# Patient Record
Sex: Male | Born: 1945 | Race: Black or African American | Hispanic: No | Marital: Single
Health system: Southern US, Community
[De-identification: ages and names within clinical notes are randomized; demographics above are authoritative.]

---

## 2007-05-01 ENCOUNTER — Emergency Department: Payer: Self-pay

## 2008-10-16 ENCOUNTER — Emergency Department: Payer: Self-pay | Admitting: Emergency Medicine

## 2009-01-11 ENCOUNTER — Emergency Department: Payer: Self-pay | Admitting: Emergency Medicine

## 2009-04-18 ENCOUNTER — Emergency Department: Payer: Self-pay

## 2009-04-22 ENCOUNTER — Emergency Department: Payer: Self-pay | Admitting: Emergency Medicine

## 2009-05-21 ENCOUNTER — Encounter: Payer: Self-pay | Admitting: Geriatric Medicine

## 2009-05-29 ENCOUNTER — Encounter: Payer: Self-pay | Admitting: Geriatric Medicine

## 2009-09-28 ENCOUNTER — Emergency Department: Payer: Self-pay | Admitting: Internal Medicine

## 2009-10-28 ENCOUNTER — Emergency Department: Payer: Self-pay | Admitting: Emergency Medicine

## 2010-04-24 ENCOUNTER — Emergency Department: Payer: Self-pay | Admitting: Unknown Physician Specialty

## 2011-01-02 ENCOUNTER — Ambulatory Visit: Payer: Self-pay | Admitting: Internal Medicine

## 2011-02-25 ENCOUNTER — Emergency Department: Payer: Self-pay | Admitting: Unknown Physician Specialty

## 2011-03-26 ENCOUNTER — Emergency Department: Payer: Self-pay | Admitting: Internal Medicine

## 2011-04-23 ENCOUNTER — Emergency Department: Payer: Self-pay | Admitting: Emergency Medicine

## 2011-08-06 ENCOUNTER — Emergency Department: Payer: Self-pay | Admitting: *Deleted

## 2011-09-05 ENCOUNTER — Emergency Department: Payer: Self-pay | Admitting: Emergency Medicine

## 2011-10-02 ENCOUNTER — Emergency Department: Payer: Self-pay | Admitting: *Deleted

## 2011-10-07 ENCOUNTER — Emergency Department: Payer: Self-pay

## 2012-02-18 ENCOUNTER — Emergency Department: Payer: Self-pay | Admitting: Emergency Medicine

## 2012-02-18 LAB — COMPREHENSIVE METABOLIC PANEL
Alkaline Phosphatase: 95 U/L (ref 50–136)
Anion Gap: 8 (ref 7–16)
BUN: 10 mg/dL (ref 7–18)
Calcium, Total: 8.8 mg/dL (ref 8.5–10.1)
Co2: 27 mmol/L (ref 21–32)
Creatinine: 0.99 mg/dL (ref 0.60–1.30)
EGFR (Non-African Amer.): >60
Osmolality: 274 (ref 275–301)
Potassium: 3.9 mmol/L (ref 3.5–5.1)
SGOT(AST): 41 U/L — ABNORMAL HIGH (ref 15–37)
Sodium: 138 mmol/L (ref 136–145)
Total Protein: 7.4 g/dL (ref 6.4–8.2)

## 2012-02-18 LAB — URINALYSIS, COMPLETE
Blood: NEGATIVE
Protein: NEGATIVE
Specific Gravity: 1.004 (ref 1.003–1.030)
Squamous Epithelial: NONE SEEN
WBC UR: NONE SEEN /HPF (ref 0–5)

## 2012-02-18 LAB — CBC
MCHC: 32.2 g/dL (ref 32.0–36.0)
Platelet: 221 10*3/uL (ref 150–440)
RBC: 4.44 10*6/uL (ref 4.40–5.90)
RDW: 14.8 % — ABNORMAL HIGH (ref 11.5–14.5)

## 2012-06-28 ENCOUNTER — Emergency Department: Payer: Self-pay | Admitting: Emergency Medicine

## 2012-09-13 ENCOUNTER — Emergency Department: Payer: Self-pay | Admitting: Emergency Medicine

## 2012-09-29 ENCOUNTER — Emergency Department: Payer: Self-pay | Admitting: Emergency Medicine

## 2012-10-18 ENCOUNTER — Emergency Department: Payer: Self-pay | Admitting: Emergency Medicine

## 2012-10-18 LAB — CBC
HCT: 45 % (ref 40.0–52.0)
HGB: 14.7 g/dL (ref 13.0–18.0)
MCH: 30 pg (ref 26.0–34.0)
Platelet: 223 10*3/uL (ref 150–440)
RDW: 14.5 % (ref 11.5–14.5)

## 2012-10-18 LAB — COMPREHENSIVE METABOLIC PANEL
Albumin: 3.4 g/dL (ref 3.4–5.0)
Anion Gap: 4 — ABNORMAL LOW (ref 7–16)
BUN: 13 mg/dL (ref 7–18)
Co2: 30 mmol/L (ref 21–32)
Creatinine: 1.14 mg/dL (ref 0.60–1.30)
EGFR (African American): >60
Glucose: 84 mg/dL (ref 65–99)
SGOT(AST): 33 U/L (ref 15–37)
SGPT (ALT): 35 U/L (ref 12–78)
Sodium: 138 mmol/L (ref 136–145)
Total Protein: 7.5 g/dL (ref 6.4–8.2)

## 2012-10-18 LAB — CK TOTAL AND CKMB (NOT AT ARMC)
CK, Total: 363 U/L — ABNORMAL HIGH (ref 35–232)
CK-MB: 3.2 ng/mL (ref 0.5–3.6)

## 2012-10-18 LAB — PRO B NATRIURETIC PEPTIDE: B-Type Natriuretic Peptide: 9 pg/mL (ref 0–125)

## 2012-12-14 ENCOUNTER — Emergency Department: Payer: Self-pay | Admitting: Emergency Medicine

## 2013-01-16 ENCOUNTER — Emergency Department: Payer: Self-pay | Admitting: Emergency Medicine

## 2013-02-13 ENCOUNTER — Emergency Department: Payer: Self-pay | Admitting: Unknown Physician Specialty

## 2013-03-28 ENCOUNTER — Emergency Department: Payer: Self-pay | Admitting: Emergency Medicine

## 2013-07-11 ENCOUNTER — Emergency Department: Payer: Self-pay | Admitting: Emergency Medicine

## 2014-06-18 ENCOUNTER — Emergency Department: Payer: Self-pay | Admitting: Emergency Medicine

## 2014-07-05 ENCOUNTER — Emergency Department: Payer: Self-pay | Admitting: Emergency Medicine

## 2014-07-05 LAB — URINALYSIS, COMPLETE
BILIRUBIN, UR: NEGATIVE
Bacteria: NONE SEEN
Blood: NEGATIVE
GLUCOSE, UR: NEGATIVE mg/dL (ref 0–75)
Hyaline Cast: 3
Ketone: NEGATIVE
LEUKOCYTE ESTERASE: NEGATIVE
Nitrite: NEGATIVE
PROTEIN: NEGATIVE
Ph: 6 (ref 4.5–8.0)
RBC,UR: <1 /HPF (ref 0–5)
SQUAMOUS EPITHELIAL: NONE SEEN
Specific Gravity: 1.008 (ref 1.003–1.030)
WBC UR: NONE SEEN /HPF (ref 0–5)

## 2014-07-05 LAB — CBC WITH DIFFERENTIAL/PLATELET
Basophil #: 0.1 10*3/uL (ref 0.0–0.1)
Basophil %: 1.3 %
Eosinophil #: 0.2 10*3/uL (ref 0.0–0.7)
Eosinophil %: 3.8 %
HCT: 46.5 % (ref 40.0–52.0)
HGB: 14.9 g/dL (ref 13.0–18.0)
LYMPHS ABS: 1.9 10*3/uL (ref 1.0–3.6)
Lymphocyte %: 35.5 %
MCH: 30.4 pg (ref 26.0–34.0)
MCHC: 32.1 g/dL (ref 32.0–36.0)
MCV: 95 fL (ref 80–100)
MONOS PCT: 13.9 %
Monocyte #: 0.7 x10 3/mm (ref 0.2–1.0)
NEUTROS PCT: 45.5 %
Neutrophil #: 2.5 10*3/uL (ref 1.4–6.5)
PLATELETS: 175 10*3/uL (ref 150–440)
RBC: 4.9 10*6/uL (ref 4.40–5.90)
RDW: 13.3 % (ref 11.5–14.5)
WBC: 5.4 10*3/uL (ref 3.8–10.6)

## 2014-07-05 LAB — COMPREHENSIVE METABOLIC PANEL
ALT: 21 U/L
ANION GAP: 8 (ref 7–16)
Albumin: 3.5 g/dL (ref 3.4–5.0)
Alkaline Phosphatase: 68 U/L
BUN: 11 mg/dL (ref 7–18)
Bilirubin,Total: 0.8 mg/dL (ref 0.2–1.0)
CALCIUM: 8.6 mg/dL (ref 8.5–10.1)
Chloride: 109 mmol/L — ABNORMAL HIGH (ref 98–107)
Co2: 27 mmol/L (ref 21–32)
Creatinine: 1.15 mg/dL (ref 0.60–1.30)
EGFR (African American): >60
EGFR (Non-African Amer.): >60
Glucose: 113 mg/dL — ABNORMAL HIGH (ref 65–99)
Osmolality: 287 (ref 275–301)
POTASSIUM: 3.4 mmol/L — AB (ref 3.5–5.1)
SGOT(AST): 22 U/L (ref 15–37)
Sodium: 144 mmol/L (ref 136–145)
Total Protein: 7.6 g/dL (ref 6.4–8.2)

## 2014-07-05 LAB — TROPONIN I

## 2014-10-29 ENCOUNTER — Ambulatory Visit: Payer: Self-pay | Admitting: Internal Medicine

## 2014-10-31 ENCOUNTER — Emergency Department: Payer: Self-pay | Admitting: Student

## 2014-10-31 LAB — CBC
HCT: 38.2 % — ABNORMAL LOW (ref 40.0–52.0)
HGB: 12.1 g/dL — AB (ref 13.0–18.0)
MCH: 27 pg (ref 26.0–34.0)
MCHC: 31.6 g/dL — ABNORMAL LOW (ref 32.0–36.0)
MCV: 86 fL (ref 80–100)
Platelet: 207 10*3/uL (ref 150–440)
RBC: 4.47 10*6/uL (ref 4.40–5.90)
RDW: 14.7 % — ABNORMAL HIGH (ref 11.5–14.5)
WBC: 14 10*3/uL — ABNORMAL HIGH (ref 3.8–10.6)

## 2014-10-31 LAB — COMPREHENSIVE METABOLIC PANEL
ALBUMIN: 1.7 g/dL — AB (ref 3.4–5.0)
ALT: 22 U/L (ref 14–63)
Alkaline Phosphatase: 110 U/L (ref 46–116)
Anion Gap: 5 — ABNORMAL LOW (ref 7–16)
BUN: 9 mg/dL (ref 7–18)
Bilirubin,Total: 0.4 mg/dL (ref 0.2–1.0)
CHLORIDE: 99 mmol/L (ref 98–107)
Calcium, Total: 8.5 mg/dL (ref 8.5–10.1)
Co2: 30 mmol/L (ref 21–32)
Creatinine: 0.8 mg/dL (ref 0.60–1.30)
EGFR (Non-African Amer.): >60
Glucose: 103 mg/dL — ABNORMAL HIGH (ref 65–99)
Osmolality: 267 (ref 275–301)
Potassium: 4.4 mmol/L (ref 3.5–5.1)
SGOT(AST): 28 U/L (ref 15–37)
Sodium: 134 mmol/L — ABNORMAL LOW (ref 136–145)
TOTAL PROTEIN: 7.2 g/dL (ref 6.4–8.2)

## 2014-10-31 LAB — PROTIME-INR
INR: 1.1
Prothrombin Time: 14.6 secs

## 2014-10-31 LAB — TROPONIN I: Troponin-I: <0.02 ng/mL

## 2014-10-31 LAB — APTT: Activated PTT: 34.2 secs (ref 23.6–35.9)

## 2014-10-31 LAB — CK TOTAL AND CKMB (NOT AT ARMC)
CK, Total: 117 U/L (ref 39–308)
CK-MB: 1.8 ng/mL (ref 0.5–3.6)

## 2014-11-01 LAB — URINALYSIS, COMPLETE
BLOOD: NEGATIVE
Bilirubin,UR: NEGATIVE
GLUCOSE, UR: NEGATIVE mg/dL (ref 0–75)
Ketone: NEGATIVE
Leukocyte Esterase: NEGATIVE
Nitrite: NEGATIVE
Ph: 7 (ref 4.5–8.0)
Protein: 30
RBC,UR: 2 /HPF (ref 0–5)
SPECIFIC GRAVITY: 1.019 (ref 1.003–1.030)
Squamous Epithelial: NONE SEEN
WBC UR: 2 /HPF (ref 0–5)

## 2014-11-02 LAB — CULTURE, BLOOD (SINGLE)

## 2014-11-12 ENCOUNTER — Observation Stay: Payer: Self-pay | Admitting: Internal Medicine

## 2014-11-27 ENCOUNTER — Ambulatory Visit
Admit: 2014-11-27 | Disposition: A | Discharge status: Home / Self Care | Payer: Self-pay | Attending: Internal Medicine | Admitting: Internal Medicine

## 2014-11-27 DEATH — deceased

## 2015-01-27 NOTE — Discharge Summary (Signed)
PATIENT NAME:  Chase Chase [name], Chase Chase [name] MR#:  [id number] DATE OF BIRTH:  09/04/1946  DATE OF ADMISSION:  11/12/2014 DATE OF DISCHARGE:  11/13/2014  ADMITTING DIAGNOSES: 1.  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  2.  Hepatitis C.  3.  Gastroesophageal reflux disease. 4.  History of prostate cancer.  DIAGNOSES AT THE TIME OF DISCHARGE: 1.  History of brain cancer with vasogenic edema causing intractable seizures.  2.  Hepatitis C.  3.  Gastroesophageal reflux disease.  4.  History of prostate cancer.   PROCEDURES: None.   CONSULTATIONS: Palliative care and oncology. Eventually, oncology consult was canceled.   BRIEF HISTORY AND PHYSICAL AND HOSPITAL COURSE: The patient is Chase [name] 69 year old African American male brought into the ED via EMS after having 3 episodes of seizures at home. Each episode lasted for approximately 5 minutes. Please see the history and physical by done by Dr. Sheryle Hailiamond. The patient had received loading dose of Keppra and the patient was admitted to the hospital. The patient was continued on Keppra and eventually palliative care and  oncology consult was placed. While awaiting for medical [facility], the patient was started on Decadron for vasogenic edema from the brain tumor. Eventually, palliative care was consulted and the patient's clinical situation was discussed with the patient's family members. As the patient's clinical situation is rapidly declining over the past few months with multiple hospitalizations at the [facility] [place] and [facility], the family asked about hospice home. The patient's code status is DO NOT RESUSCITATE. Oncology consult was canceled as per family request and the plan is to transfer him to [facility] to Dr. Harriett SineNancy Phifer's service as soon as Chase [name] bed is available.     PHYSICAL EXAMINATION: VITAL SIGNS: Temperature 98.4, pulse 86, respirations 20, blood pressure 117/79, pulse oximetry 94% at rest on room air.  GENERAL APPEARANCE: Frail appearing.    HEENT: Oropharynx clear. Hearing intact to voice.  NECK: Supple. Trachea is midline. RESPIRATORY:  Lungs are clear to auscultation. No accessory muscle use. No rales, rhonchi.  CARDIOVASCULAR: S1, S2 normal. Regular rate and rhythm. No murmur.  GASTROINTESTINAL: Soft. Bowel sounds are positive in all 4 quadrants. Nontender, nondistended.  GENITOURINARY: No suprapubic tenderness.  EXTREMITIES: No edema. No cyanosis.  SKIN: No rashes. No ulcers. NEUROLOGIC:  Right eye droop. Follows simple commands. Speech is clear.  PSYCHIATRIC: Alert, awake, and oriented to time, place, and person.  LABORATORY AND IMAGING STUDIES: On February 15 the BMP is normal. Troponin less than 0.02. WBC 19.2, hemoglobin 12.4, hematocrit 39.2, platelets of 276,000. Blood cultures, no growth x 2 since admission. CT of the head without contrast, little interval change in appearance of the high right frontal lobe mass with significant surrounding vasogenic edema compatible with tumor.  No new intracranial abnormalities.  MEDICATIONS AT THE TIME OF DISCHARGE: Furosemide 20 mg 1 tablet p.o. once daily, Tylenol 325 mg 2 tablets every 6 hours as needed for mild pain or temperature greater than 100.4, morphine 30 mg for 12 hours 1 tablet p.o. every 12 hours, oxycodone 5 mg in 5 mL oral solution, 5 mL every 4 hours as needed for moderate pain, Keppra 500 mg p.o. q. 12 hours, pantoprazole 40 mg once daily, lorazepam 0.5 mg 1-2 tablets orally or sublingually every 2-4 hours as needed for agitation and anxiety.   ACTIVITY: As tolerated.   DIET: As tolerated.   CODE STATUS:  DO NOT RESUSCITATE.  DISCHARGE INSTRUCTIONS:  Oxygen as needed. Leave in Foley catheter for  urinary incontinence to prevent skin breakdown. The patient will be transferred to [facility]ospice [facility] to Dr. Ernestene Kiel service as soon as Chase [name] bed is available.  TOTAL TIME SPENT ON THE DISCHARGE: 45 minutes.   ____________________________ Ramonita Lab, MD ag:LT D: 11/13/2014  14:09:00 ET T: 11/13/2014 18:46:36 ET JOB#: [id number]  cc: Ned Grace, MD Ramonita Lab, MD, <Dictator> Primary Care Physician   Ramonita Lab MD ELECTRONICALLY SIGNED 11/23/2014 14:53

## 2015-01-27 NOTE — H&P (Signed)
PATIENT NAME:  Chase [name], Chase [name] MR#:  [id number] DATE OF BIRTH:  03/06/1946  DATE OF ADMISSION:  11/12/2014  REFERRING PHYSICIAN:  Loraine LericheMark R. Fanny BienQuale, MD  PRIMARY CARE PHYSICIAN:  With the [employer].   ADMISSION DIAGNOSIS:  Intractable seizures.   HISTORY OF PRESENT ILLNESS:  This is [name] 69 year old African-American male who presents to the Emergency Department via EMS after suffering 3 seizures at home. The longest of the seizures lasted at least 5 minutes. His wife, who is the primary caretaker, called the home health nurse, who recommended transport and evaluation in the Emergency Department. In the Emergency Department, the patient was loaded with Keppra. Otherwise, his vital signs were good, although his son reports that at home he experienced some respiratory distress. In the Emergency Department, the patient was postictal and could not contribute to his own history. At baseline, he is alert and verbal. Past medical history is significant for brain cancer. Due to his recurrent seizures, the Emergency Department called for admission.   REVIEW OF SYSTEMS:  The patient has recently been discharged [facility] [place]. I do not have records at this hospital administration, but since being home, he has been well. The patient is currently very somnolent and confused and cannot contribute to his own history.   PAST MEDICAL HISTORY:  Brain tumor, hepatitis C, seizures, and history of prostate cancer.   PAST SURGICAL HISTORY:  Cervical spine fusion and hernia repair, as well as pericardial window placement.   SOCIAL HISTORY:  The patient is [name] former smoker. He also is [name] former drinker. His son denies any knowledge of drug use.   FAMILY HISTORY:  The patient's mother is deceased of pancreatic cancer, and his father is deceased of colon cancer.   MEDICATIONS: 1.  Furosemide 20 mg 1 tab p.o. daily.  2.  Gabapentin 300 mg 1 capsule p.o. at bedtime.  3.  Morphine 30 mg  1 tab p.o. b.i.d.  4.  Omeprazole 20 mg 1 tablet p.o. daily.  5.  Oxycodone 10 mg 1 tab p.o. every 6 hours.   ALLERGIES:  PENICILLIN.   PERTINENT LABORATORY RESULTS AND RADIOGRAPHIC FINDINGS:  Serum glucose is 92, BUN 17, creatinine 1.03, sodium 136, potassium 3.9, chloride 98, bicarbonate 30, and calcium is 8.9. Troponin is negative. White blood cell count is 19.2, hemoglobin 12.4, hematocrit 39.2, platelet count 276,000, and MCV is 83. Urinalysis is negative for infection. CT of the head without contrast shows little interval change in the appearance of [name] high right frontal lobe mass with significant surrounding vasogenic edema compatible with tumor. Chest x-ray shows mild unchanged linear left basal opacities, and my read is that this appears to be atelectasis.   PHYSICAL EXAMINATION: VITAL SIGNS:  Temperature is 98.3, pulse 99, respirations 20, blood pressure 110/74, and pulse oximetry is 93% on room air but frequently improves to close to 100%.  GENERAL:  The patient is somnolent but in no apparent distress.  HEENT:  Normocephalic, atraumatic. Pupils are equal, round, and reactive to light and accommodation. Extraocular movements are intact. Mucous membranes are moist.  NECK:  Trachea is midline. No adenopathy. Thyroid is nonpalpable and nontender.  CHEST:  Symmetric and atraumatic.  CARDIOVASCULAR:  Regular rate and rhythm. Normal S1 and S2. No rubs, clicks, or murmurs appreciated.  LUNGS:  Clear to auscultation bilaterally. Normal effort and excursion.  ABDOMEN:  Positive bowel sounds. Soft, nontender, nondistended. No hepatosplenomegaly.  GENITOURINARY:  Deferred.  MUSCULOSKELETAL:  The patient  does not follow commands at this time, but I have observed him move both of the upper extremities, although I cannot verify full range of motion. The patient is too weak to walk at home and thus was not challenged for strength testing in the hospital.  SKIN:  Warm and dry. There are no rashes or  lesions.  EXTREMITIES:  No clubbing, cyanosis, or edema.  NEUROLOGIC:  Cranial nerves II through XII appear to be grossly intact, although I cannot get [name] thorough neurologic exam on him, as he is somnolent and somewhat uncooperative.  PSYCHIATRIC:  Difficult to assess due to the patient's postictal state.   ASSESSMENT AND PLAN:  This is [name] 69 year old male admitted for intractable seizures secondary to [name] brain tumor.   1.  Seizures. No seizures witnessed in the Emergency Department. Of course, this abnormal brain activity is likely secondary to his brain tumor. He was loaded with Keppra in the Emergency Department. We will likely continue Keppra following hospital discharge. Neurologic consult at the discretion of the primary team in addition if this is felt necessary after his palliative care consult.  2.  Hepatitis C. The patient is not currently undergoing treatment.  3.  Gastroesophageal reflux disease. We will continue the patient's pantoprazole.  4.  Deep vein thrombosis prophylaxis with heparin.  5.  Gastrointestinal prophylaxis, as above.  6.  Brain cancer. The patient is already a DO NOT RESUSCITATE. He needs [name] palliative care consult to arrange for hospice services at home.   CODE STATUS:  The patient is a DO NOT RESUSCITATE. Again, he does not want resuscitative measures in the event of cardiopulmonary collapse.   TIME SPENT ON ADMISSION ORDERS AND PATIENT CARE:  Approximately 35 minutes.    ____________________________ Kelton Pillar. Sheryle Hail, MD msd:nb D: 11/13/2014 05:31:59 ET T: 11/13/2014 05:44:22 ET JOB#: [id number]  cc: Kelton Pillar. Sheryle Hail, MD, <Dictator> Kelton Pillar Delon Revelo MD ELECTRONICALLY SIGNED 11/20/2014 12:07

## 2015-01-27 NOTE — Consult Note (Signed)
   Comments   I met with pt's wife and son. Both describe a rapid decline over the past few months associated with multiple hospitalizations at the [facility] and [facility]. He was receiving oncology care and was last discharged from the [facility] 8 days ago with a referral to [facility]. However, family have been displeased with the care he has received and no longer feel he can return home. They ask about the Hospice Home. Will ask that the hospice liaison meet with them to discuss transfer.  family request, will cancel oncology consult. Family is focused on comfort/hospice care and were told that patient likely only had two months to live. oncology consult0.76m IV prnHome  Electronic Signatures: Borders, JKirt Boys(NP)  (Signed 16-Feb-16 11:23)  Authored: Palliative Care Phifer, NIzora Gala(MD)  (Signed 16-Feb-16 15:54)  Authored: Palliative Care   Last Updated: 16-Feb-16 15:54 by Phifer, NIzora Gala(MD)

## 2015-01-27 NOTE — Discharge Summary (Signed)
PATIENT NAME:  Chase [name], Chase [name] MR#:  [id number] DATE OF BIRTH:  1945-10-16  DATE OF ADMISSION:  11/12/2014 DATE OF DISCHARGE:  11/14/2014  ADDENDUM    Discharge summary was dictated on 11/13/2014 this is an addendum to the previous discharge summary. Please review discharge summary dictated 11/13/2014 for details.    The patient was not discharged on 11/13/2014 as we planned before, as the patient changed [facility].   The patient was evaluated by physical therapy, as the patient and his family members were pursuing skilled nursing facility. Eventually, after having several discussions the patient's family members, as the patient was feeling weak and tired that day and being lethargic, after talking to palliative care decision was made to sent him to hospice home. The patient was transferred to hospice home to Dr. Harriett SineNancy Phifer's service.   CODE STATUS: His code status was DO NOT RESUSCITATE at that time.    ____________________________ Chase LabAruna Brookelyn Gaynor, MD ag:bm D: 11/20/2014 20:45:15 ET T: 11/21/2014 04:54:0906:29:22 ET JOB#: [id number]  cc: Chase LabAruna Jguadalupe Opiela, MD, <Dictator> Chase LabARUNA Demaree Liberto MD ELECTRONICALLY SIGNED 11/23/2014 15:05

## 2015-01-27 NOTE — Consult Note (Signed)
   Comments   Pt stated that he did not want to go to the Hospice Home. Discussed with wife and sons. They would accept [facility] where pt has been before and which is close to family.   Electronic Signatures: Drisana Schweickert, Harriett SineNancy (MD)  (Signed 16-Feb-16 15:55)  Authored: Palliative Care   Last Updated: 16-Feb-16 15:55 by Trayvon Trumbull, Harriett SineNancy (MD)

## 2016-02-03 IMAGING — CR DG CHEST 2V
1 series · 3 of 3 positions shown · non-contrast
Comparison: Chest radiograph performed 02/18/2012

CLINICAL DATA: Acute onset of generalized weakness. Slipping out of
bed. Initial encounter.

EXAM:
CHEST  2 VIEW

[Series 2: w chest lat · 0.14mm/px · 3 of 3 slices shown]
[im 1/3]
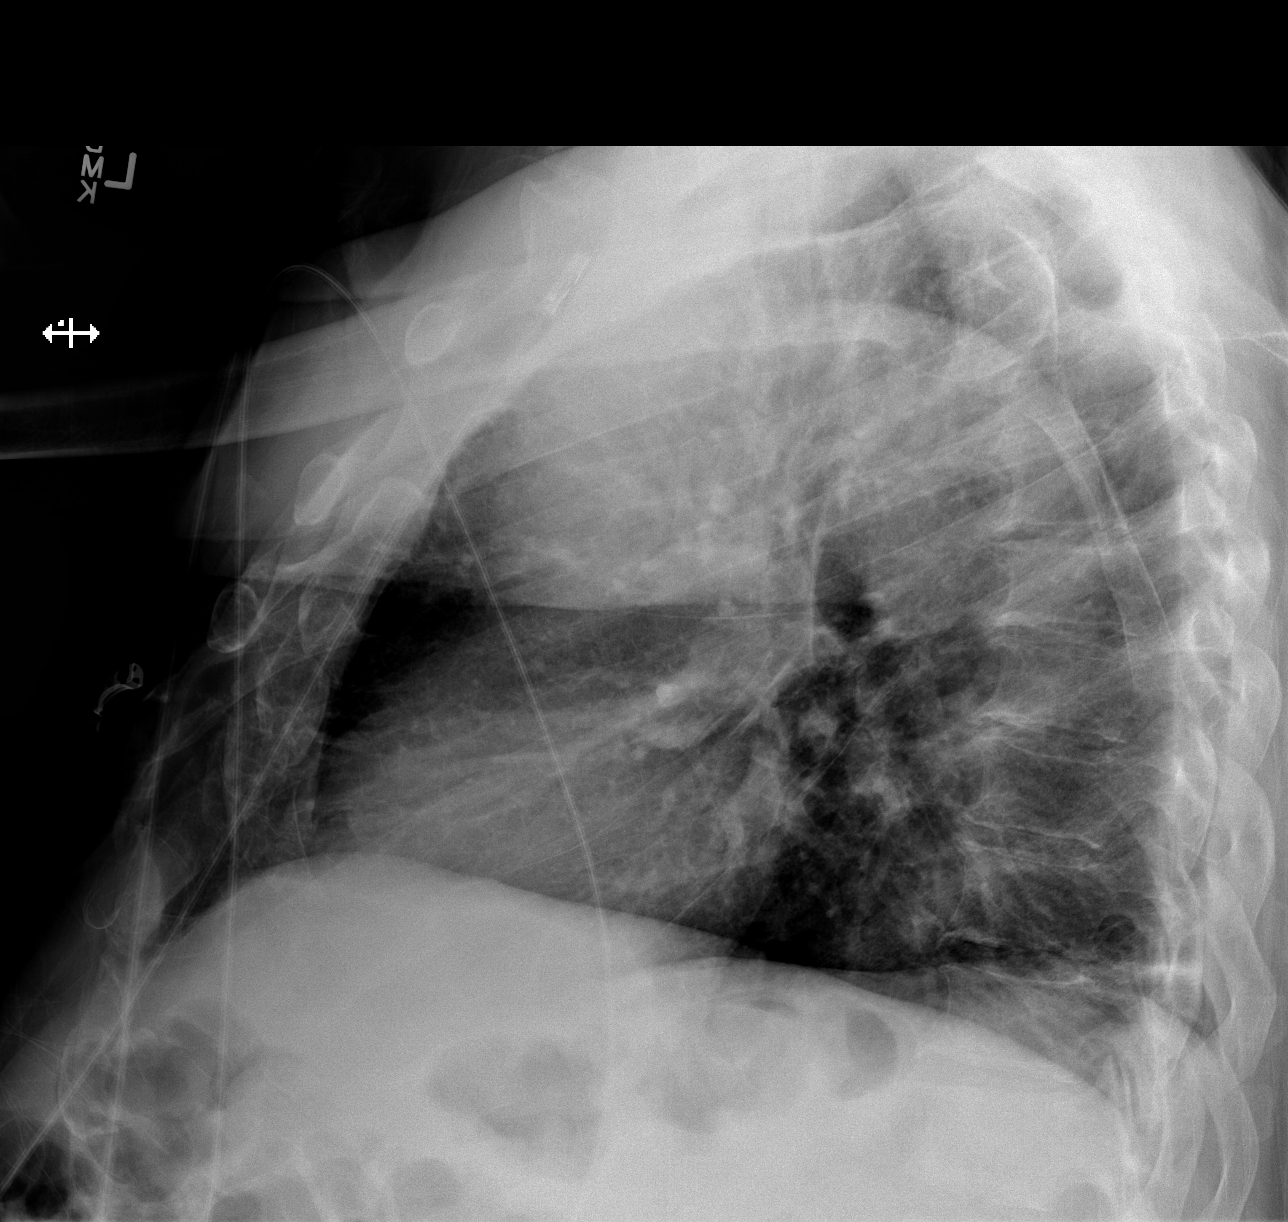
[im 2/3]
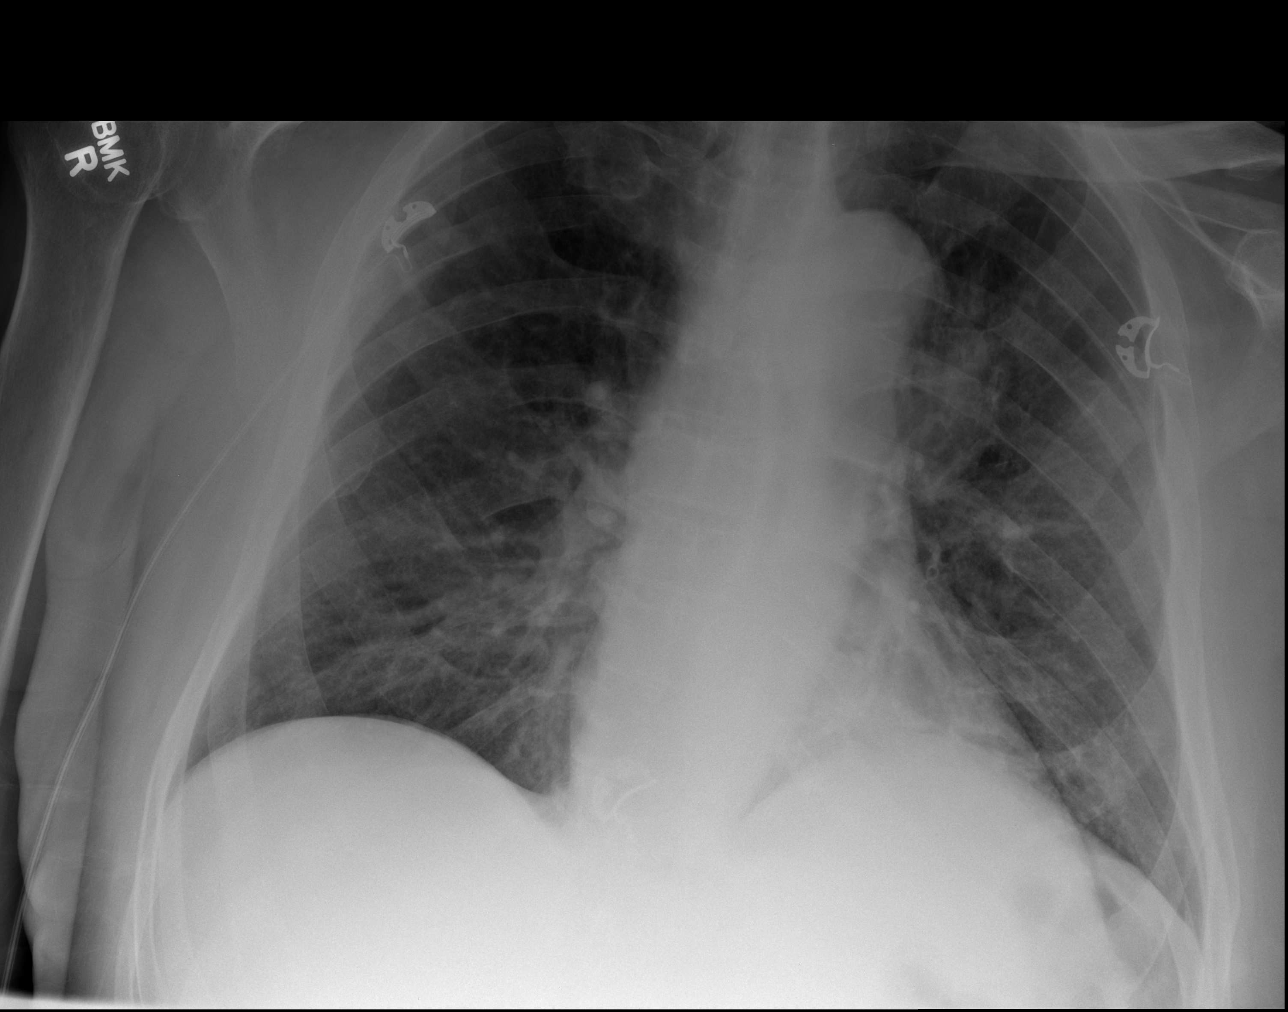
[im 3/3]
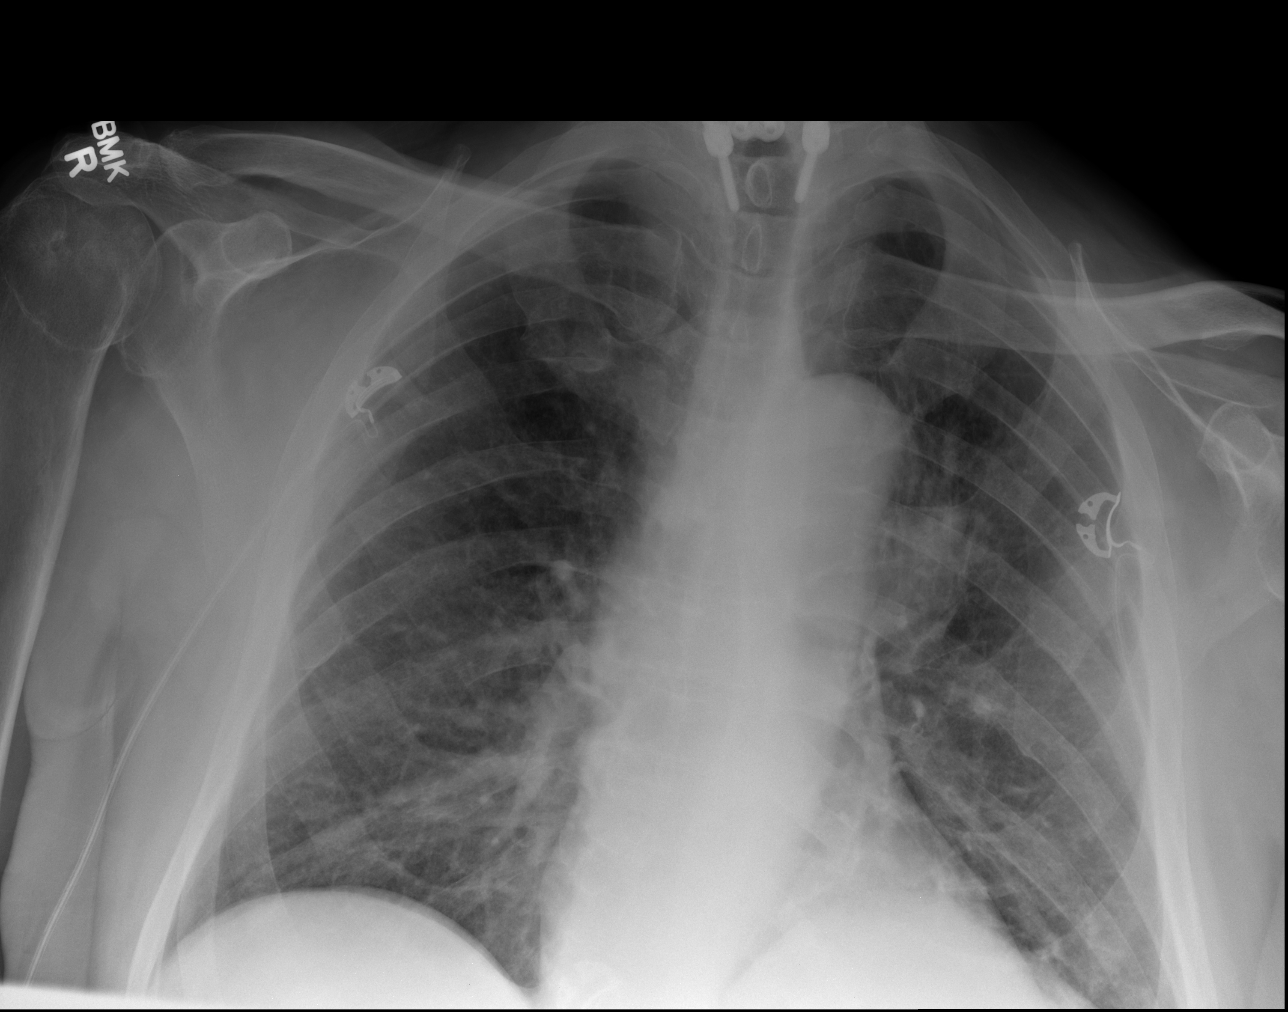

[3 of 3 positions shown; findings below may reference images not displayed]

FINDINGS: The lungs are well-aerated. Pulmonary vascularity is at the upper
limits of normal. Mild left basilar opacity likely reflects
atelectasis. No pleural effusion or pneumothorax is seen.

The heart is normal in size; the mediastinal contour is within
normal limits. No acute osseous abnormalities are seen. Cervical
spinal fusion hardware is partially imaged.
IMPRESSION: Mild left basilar opacity likely reflects atelectasis.

## 2016-02-03 IMAGING — CT CT HEAD WITHOUT CONTRAST
2 of 3 series · 16 of 30 positions shown, 19 images · non-contrast
Comparison: Head CT 02/13/2013.

CLINICAL DATA: 68-year-old male with history of generalized
weakness. History of brain cancer. In between radiation treatments.

EXAM:
CT HEAD WITHOUT CONTRAST
TECHNIQUE: Contiguous axial images were obtained from the base of the skull
through the vertex without intravenous contrast.

[Series 2: head wo · axial · 0.44mm/px · z∈[+412,+538]mm · 9 of 36 slices shown, 12 images]
[im 4/36  brain]
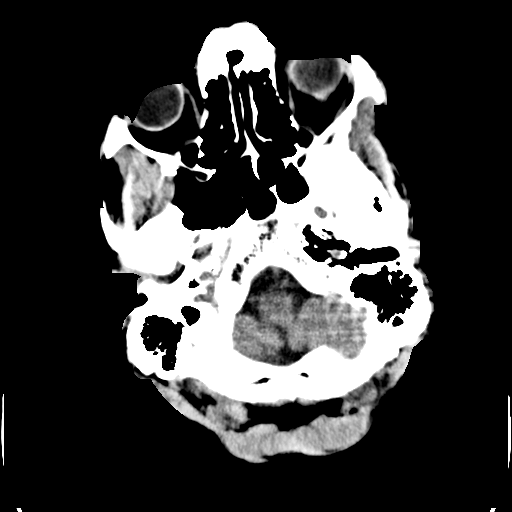
[im 4/36  bone]
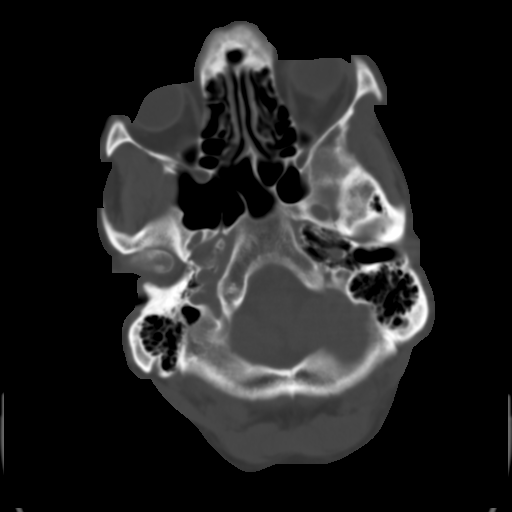
[im 8/36  brain]
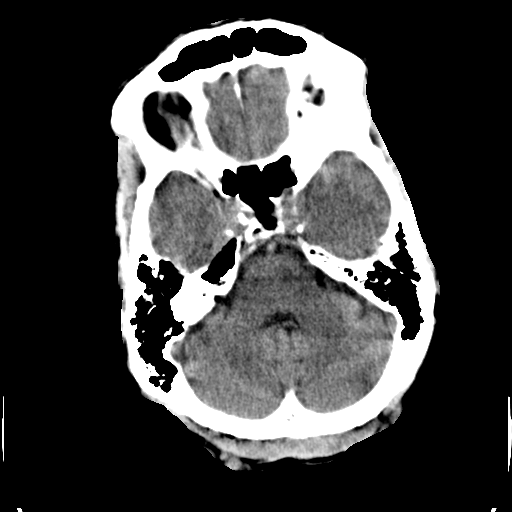
[im 11/36  brain]
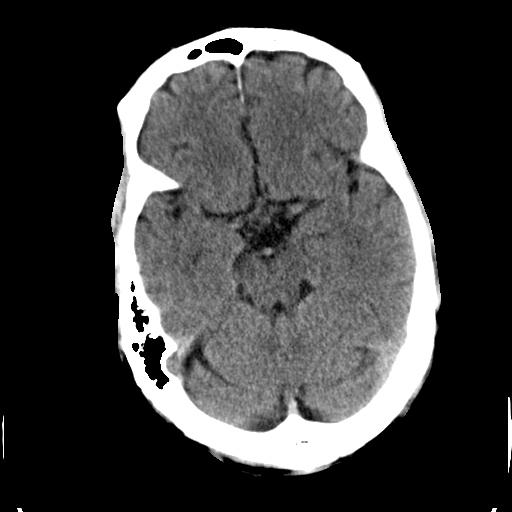
[im 15/36  brain]
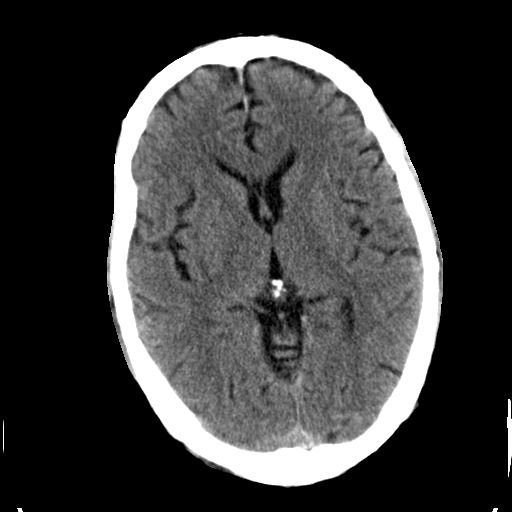
[im 18/36  brain]
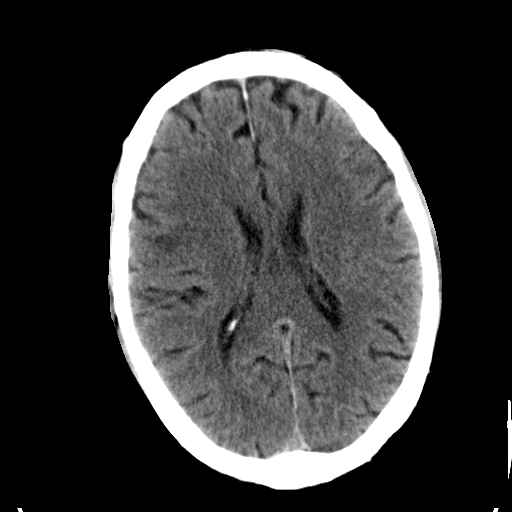
[im 18/36  bone]
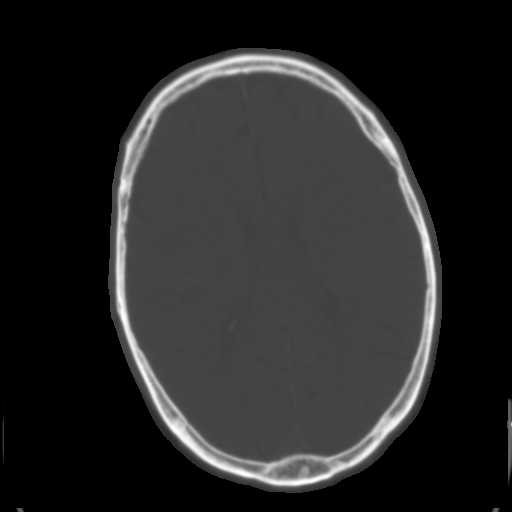
[im 22/36  brain]
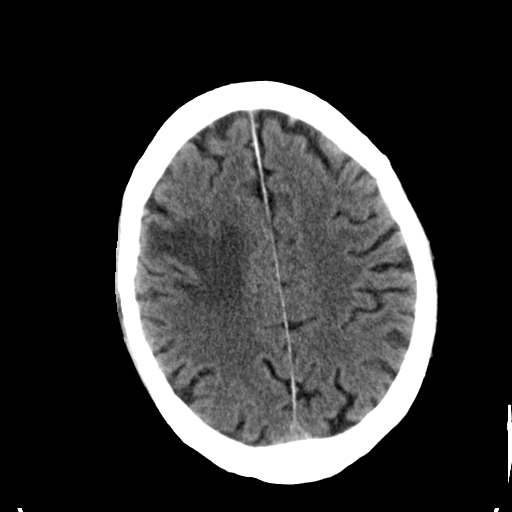
[im 25/36  brain]
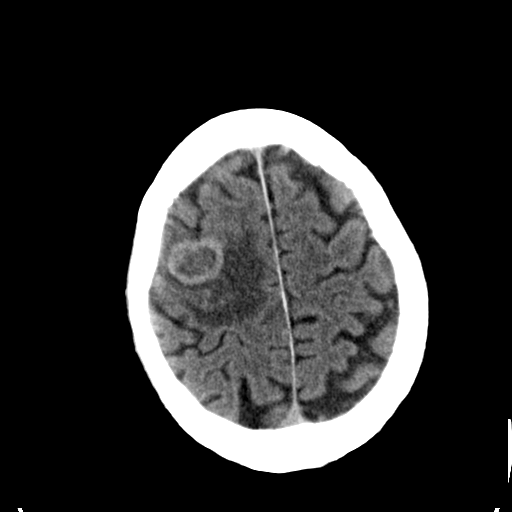
[im 29/36  brain]
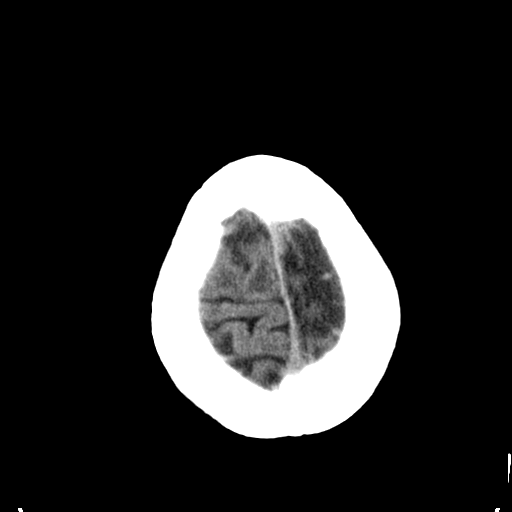
[im 32/36  brain]
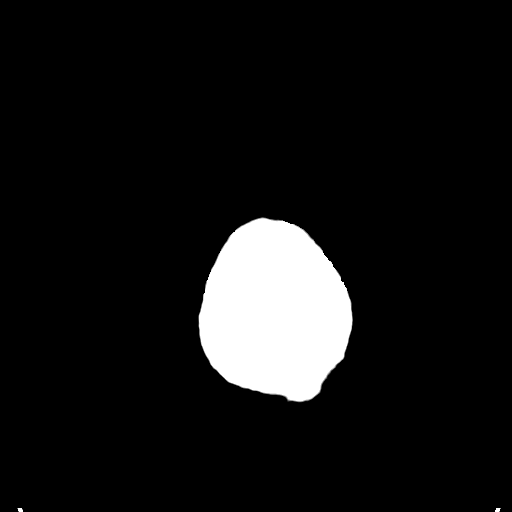
[im 32/36  bone]
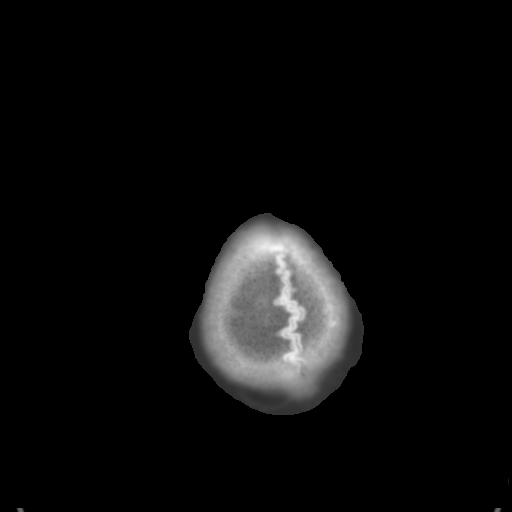

[Series 602: st recon · axial · 0.49mm/px · z∈[+459,+557]mm · 7 of 29 slices shown]
[im 4/29  brain]
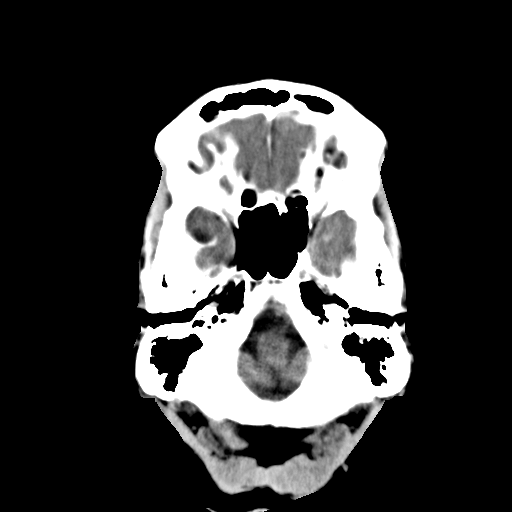
[im 8/29  brain]
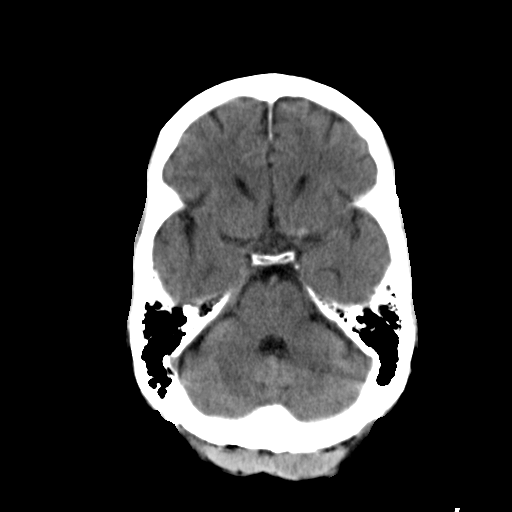
[im 11/29  brain]
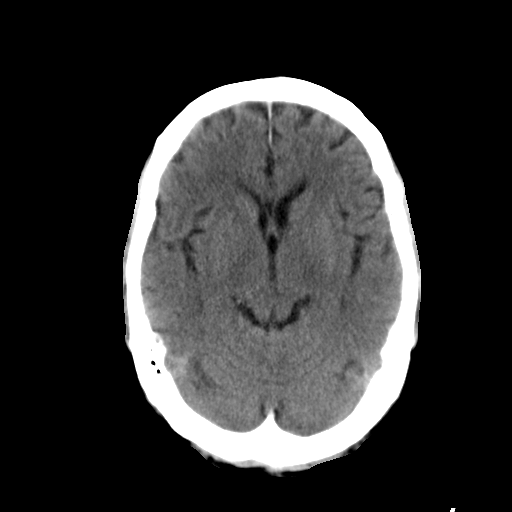
[im 15/29  brain]
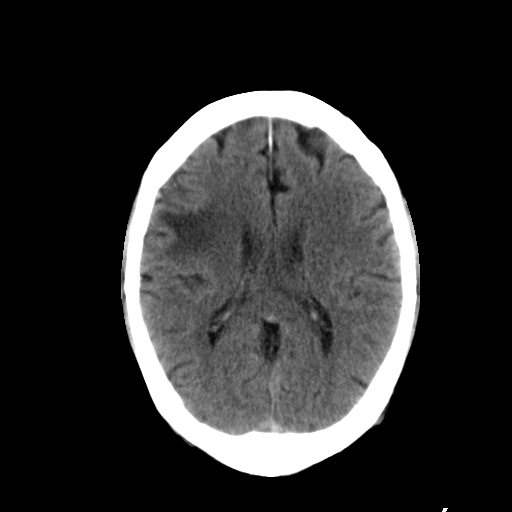
[im 18/29  brain]
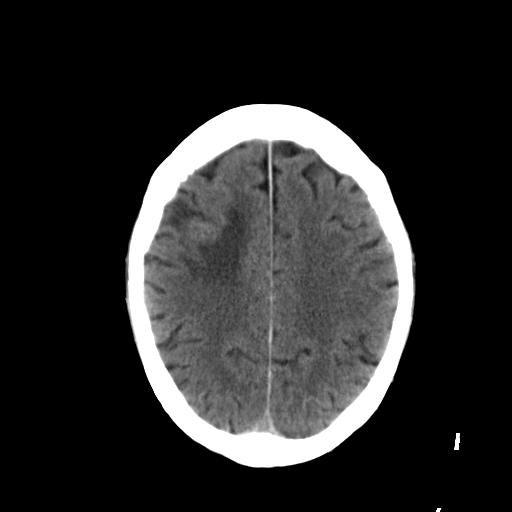
[im 22/29  brain]
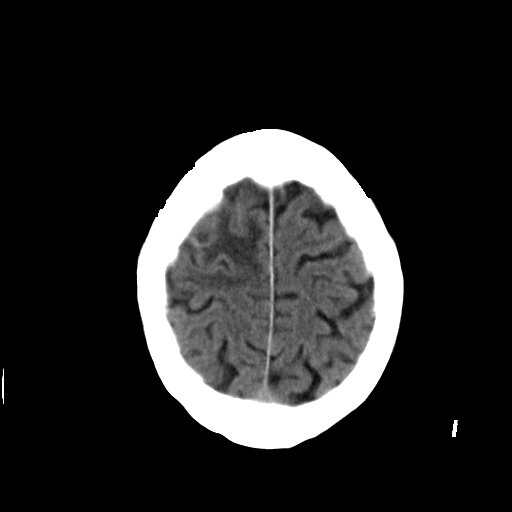
[im 25/29  brain]
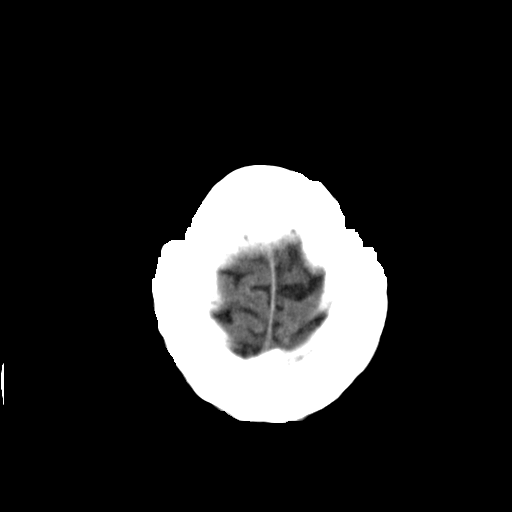

[16 of 30 positions shown; findings below may reference images not displayed]

FINDINGS: In the right frontal lobe there is a 2.5 x 2.0 cm lesion, which is
heterogeneous in attenuation, with central areas of lower
attenuation and a peripheral rim of intermediate attenuation (42
HU), compatible with the patient's reported neoplasm. This is
surrounded by extensive low-attenuation, presumably surrounding
edema and/or post treatment related gliosis. This is minimal
associated local mass effect, but there is no evidence of
significant midline shift or signs to suggest frank herniation at
this time. No evidence of macroscopic intracranial hemorrhage. No
hydrocephalus or other abnormal intra or extra-axial fluid
collections. Visualized paranasal sinuses and mastoids are well
pneumatized. No acute displaced skull fractures are noted.
IMPRESSION: 1. 2.6 x 2.0 cm right frontal lobe mass with surrounding edema
and/or gliosis. This is new compared to prior CT scan 02/13/2013.
However, given the patient's reported known diagnosis of "brain
cancer" presumably the patient has more recent outside CT or MRI
examinations. Acquisition of these studies would be useful for
comparison if possible.
